# Patient Record
Sex: Female | Born: 2000 | Race: Black or African American | Hispanic: No | Marital: Single | Smoking: Never smoker
Health system: Southern US, Community
[De-identification: ages and names within clinical notes are randomized; demographics above are authoritative.]

---

## 2020-04-13 ENCOUNTER — Encounter (HOSPITAL_COMMUNITY): Payer: Self-pay | Admitting: Emergency Medicine

## 2020-04-13 ENCOUNTER — Ambulatory Visit (HOSPITAL_COMMUNITY)
Admission: EM | Admit: 2020-04-13 | Discharge: 2020-04-13 | Disposition: A | Discharge status: Home / Self Care | Payer: Medicaid Other | Attending: Family Medicine | Admitting: Family Medicine

## 2020-04-13 ENCOUNTER — Other Ambulatory Visit: Payer: Self-pay

## 2020-04-13 DIAGNOSIS — H9191 Unspecified hearing loss, right ear: Secondary | ICD-10-CM | POA: Diagnosis not present

## 2020-04-13 DIAGNOSIS — H6121 Impacted cerumen, right ear: Secondary | ICD-10-CM

## 2020-04-13 NOTE — ED Triage Notes (Signed)
Pt presents with right ear discomfort and hearing loss xs 5 days. States has cleaned with hydrogen peroxide.

## 2020-04-13 NOTE — ED Provider Notes (Signed)
  [facility]   [id number] 04/13/20 Arrival Time: 1217  ASSESSMENT & PLAN:  1. Impacted cerumen of right ear   2. Decreased hearing of right ear     Cerumen removed from R ear canal with curette by me. No complications. Reports normal hearing.  May f/u as needed. Reviewed expectations re: course of current medical issues. Questions answered. Outlined signs and symptoms indicating need for more acute intervention. Patient verbalized understanding. After Visit Summary given.   SUBJECTIVE: History from: patient.  Alisha Clark is a 20 y.o. female who presents with complaint of decreased hearing from R ear; gradual over past week. No pain. Does occas use Q-tips in ears. No injury/bleeding. No recent illnesses.   OBJECTIVE:  Vitals:   04/13/20 1248  BP: 101/69  Pulse: 66  Resp: 16  Temp: 98.2 F (36.8 C)  TempSrc: Oral  SpO2: 98%     General appearance: alert; NAD Ear Canal: cerumen on the right TM: right: normal  Neck: supple without LAD Lungs: unlabored respirations, symmetrical air entry; cough: absent; no respiratory distress Skin: warm and dry Psychological: alert and cooperative; normal mood and affect  No Known Allergies  History reviewed. No pertinent past medical history. History reviewed. No pertinent family history. Social History   Socioeconomic History  . Marital status: Single    Spouse name: Not on file  . Number of children: Not on file  . Years of education: Not on file  . Highest education level: Not on file  Occupational History  . Not on file  Tobacco Use  . Smoking status: Never Smoker  . Smokeless tobacco: Never Used  Substance and Sexual Activity  . Alcohol use: Never  . Drug use: Not on file  . Sexual activity: Not on file  Other Topics Concern  . Not on file  Social History Narrative  . Not on file   Social Determinants of Health   Financial Resource Strain: Not on file  Food Insecurity: Not on file   Transportation Needs: Not on file  Physical Activity: Not on file  Stress: Not on file  Social Connections: Not on file  Intimate Partner Violence: Not on file            Mardella Layman, MD 04/13/20 1427

## 2020-04-20 ENCOUNTER — Ambulatory Visit (HOSPITAL_COMMUNITY)
Admission: EM | Admit: 2020-04-20 | Discharge: 2020-04-20 | Disposition: A | Discharge status: Home / Self Care | Payer: Medicaid Other

## 2020-04-20 ENCOUNTER — Other Ambulatory Visit: Payer: Self-pay

## 2020-04-20 ENCOUNTER — Emergency Department (HOSPITAL_COMMUNITY)
Admission: EM | Admit: 2020-04-20 | Discharge: 2020-04-20 | Disposition: A | Discharge status: Home / Self Care | Payer: Medicaid Other | Attending: Emergency Medicine | Admitting: Emergency Medicine

## 2020-04-20 ENCOUNTER — Encounter (HOSPITAL_COMMUNITY): Payer: Self-pay

## 2020-04-20 DIAGNOSIS — Z5321 Procedure and treatment not carried out due to patient leaving prior to being seen by health care provider: Secondary | ICD-10-CM | POA: Diagnosis not present

## 2020-04-20 DIAGNOSIS — R1110 Vomiting, unspecified: Secondary | ICD-10-CM | POA: Diagnosis not present

## 2020-04-20 DIAGNOSIS — R109 Unspecified abdominal pain: Secondary | ICD-10-CM | POA: Insufficient documentation

## 2020-04-20 DIAGNOSIS — R531 Weakness: Secondary | ICD-10-CM | POA: Diagnosis not present

## 2020-04-20 LAB — CBC
HCT: 40.4 % (ref 36.0–46.0)
Hemoglobin: 13.9 g/dL (ref 12.0–15.0)
MCH: 31.5 pg (ref 26.0–34.0)
MCHC: 34.4 g/dL (ref 30.0–36.0)
MCV: 91.6 fL (ref 80.0–100.0)
Platelets: 202 10*3/uL (ref 150–400)
RBC: 4.41 MIL/uL (ref 3.87–5.11)
RDW: 12.6 % (ref 11.5–15.5)
WBC: 16.6 10*3/uL — ABNORMAL HIGH (ref 4.0–10.5)
nRBC: 0 % (ref 0.0–0.2)

## 2020-04-20 LAB — COMPREHENSIVE METABOLIC PANEL
ALT: 12 U/L (ref 0–44)
AST: 14 U/L — ABNORMAL LOW (ref 15–41)
Albumin: 3.9 g/dL (ref 3.5–5.0)
Alkaline Phosphatase: 42 U/L (ref 38–126)
Anion gap: 10 (ref 5–15)
BUN: 14 mg/dL (ref 6–20)
CO2: 23 mmol/L (ref 22–32)
Calcium: 9.4 mg/dL (ref 8.9–10.3)
Chloride: 105 mmol/L (ref 98–111)
Creatinine, Ser: 1.22 mg/dL — ABNORMAL HIGH (ref 0.44–1.00)
GFR, Estimated: >60 mL/min (ref >60)
Glucose, Bld: 97 mg/dL (ref 70–99)
Potassium: 3.5 mmol/L (ref 3.5–5.1)
Sodium: 138 mmol/L (ref 135–145)
Total Bilirubin: 1.1 mg/dL (ref 0.3–1.2)
Total Protein: 7.1 g/dL (ref 6.5–8.1)

## 2020-04-20 LAB — URINALYSIS, ROUTINE W REFLEX MICROSCOPIC
Bilirubin Urine: NEGATIVE
Glucose, UA: NEGATIVE mg/dL
Ketones, ur: 20 mg/dL — AB
Nitrite: NEGATIVE
Protein, ur: 100 mg/dL — AB
Specific Gravity, Urine: 1.024 (ref 1.005–1.030)
WBC, UA: >50 WBC/hpf — ABNORMAL HIGH (ref 0–5)
pH: 5 (ref 5.0–8.0)

## 2020-04-20 LAB — I-STAT BETA HCG BLOOD, ED (MC, WL, AP ONLY): I-stat hCG, quantitative: <5 m[IU]/mL (ref <5)

## 2020-04-20 LAB — LIPASE, BLOOD: Lipase: 24 U/L (ref 11–51)

## 2020-04-20 NOTE — ED Triage Notes (Signed)
Patient complains of generalized weakness, vomiting, and abdominal pain that started suddenly yesterday. Denies diarrhea. Patient alert, oriented, and in no apparent distress at this time.

## 2020-04-20 NOTE — ED Notes (Signed)
Pt reported to [place], [place].  Pt went to the ED due to abdominal pain.

## 2020-04-20 NOTE — ED Notes (Signed)
Pt up to desk stating that she will be leaving, returned labels to be shredded.

## 2020-04-20 NOTE — ED Triage Notes (Signed)
Pt reports pain around belly button "feels tight" x 1 day. States she feels nauseous in the lobby. Denies fever, diarrhea, vomiting. Ibuprofen gives no relief, last dose 8 am today.   Pt wants to know if the symptoms are due to COVID vaccine side effect, as she had 2 dose 5 days ago.

## 2020-04-29 ENCOUNTER — Encounter (HOSPITAL_COMMUNITY): Payer: Self-pay

## 2020-04-29 ENCOUNTER — Emergency Department (HOSPITAL_COMMUNITY): Payer: Medicaid Other

## 2020-04-29 ENCOUNTER — Emergency Department (HOSPITAL_COMMUNITY)
Admission: EM | Admit: 2020-04-29 | Discharge: 2020-04-29 | Disposition: A | Discharge status: Home / Self Care | Payer: Medicaid Other | Attending: Emergency Medicine | Admitting: Emergency Medicine

## 2020-04-29 ENCOUNTER — Other Ambulatory Visit: Payer: Self-pay

## 2020-04-29 DIAGNOSIS — R1084 Generalized abdominal pain: Secondary | ICD-10-CM | POA: Insufficient documentation

## 2020-04-29 DIAGNOSIS — R109 Unspecified abdominal pain: Secondary | ICD-10-CM

## 2020-04-29 LAB — COMPREHENSIVE METABOLIC PANEL
ALT: 13 U/L (ref 0–44)
AST: 15 U/L (ref 15–41)
Albumin: 3.9 g/dL (ref 3.5–5.0)
Alkaline Phosphatase: 41 U/L (ref 38–126)
Anion gap: 11 (ref 5–15)
BUN: 16 mg/dL (ref 6–20)
CO2: 24 mmol/L (ref 22–32)
Calcium: 8.9 mg/dL (ref 8.9–10.3)
Chloride: 102 mmol/L (ref 98–111)
Creatinine, Ser: 0.94 mg/dL (ref 0.44–1.00)
GFR, Estimated: >60 mL/min (ref >60)
Glucose, Bld: 84 mg/dL (ref 70–99)
Potassium: 3.6 mmol/L (ref 3.5–5.1)
Sodium: 137 mmol/L (ref 135–145)
Total Bilirubin: 0.4 mg/dL (ref 0.3–1.2)
Total Protein: 7.2 g/dL (ref 6.5–8.1)

## 2020-04-29 LAB — URINALYSIS, ROUTINE W REFLEX MICROSCOPIC
Bacteria, UA: NONE SEEN
Bilirubin Urine: NEGATIVE
Glucose, UA: NEGATIVE mg/dL
Hgb urine dipstick: NEGATIVE
Ketones, ur: 5 mg/dL — AB
Nitrite: NEGATIVE
Protein, ur: 30 mg/dL — AB
Specific Gravity, Urine: 1.034 — ABNORMAL HIGH (ref 1.005–1.030)
pH: 5 (ref 5.0–8.0)

## 2020-04-29 LAB — WET PREP, GENITAL
Sperm: NONE SEEN
Trich, Wet Prep: NONE SEEN
Yeast Wet Prep HPF POC: NONE SEEN

## 2020-04-29 LAB — GC/CHLAMYDIA PROBE AMP (~~LOC~~) NOT AT ARMC
Chlamydia: NEGATIVE
Comment: NEGATIVE
Comment: NORMAL
Neisseria Gonorrhea: POSITIVE — AB

## 2020-04-29 LAB — CBC
HCT: 38.3 % (ref 36.0–46.0)
Hemoglobin: 12.5 g/dL (ref 12.0–15.0)
MCH: 30.7 pg (ref 26.0–34.0)
MCHC: 32.6 g/dL (ref 30.0–36.0)
MCV: 94.1 fL (ref 80.0–100.0)
Platelets: 264 10*3/uL (ref 150–400)
RBC: 4.07 MIL/uL (ref 3.87–5.11)
RDW: 12.4 % (ref 11.5–15.5)
WBC: 9.3 10*3/uL (ref 4.0–10.5)
nRBC: 0 % (ref 0.0–0.2)

## 2020-04-29 LAB — LIPASE, BLOOD: Lipase: 30 U/L (ref 11–51)

## 2020-04-29 LAB — I-STAT BETA HCG BLOOD, ED (MC, WL, AP ONLY): I-stat hCG, quantitative: <5 m[IU]/mL (ref <5)

## 2020-04-29 MED ORDER — PANTOPRAZOLE SODIUM 20 MG PO TBEC: 20.0000 mg | DELAYED_RELEASE_TABLET | Freq: Every day | ORAL | 0 refills | Status: DC

## 2020-04-29 MED ORDER — IOHEXOL 300 MG/ML  SOLN
100.0000 mL | Freq: Once | INTRAMUSCULAR | Status: AC | PRN
  Administered 2020-04-29: 100 mL via INTRAVENOUS

## 2020-04-29 MED ORDER — SODIUM CHLORIDE 0.9 % IV BOLUS
1000.0000 mL | Freq: Once | INTRAVENOUS | Status: AC
  Administered 2020-04-29: 1000 mL via INTRAVENOUS

## 2020-04-29 MED ORDER — SUCRALFATE 1 GM/10ML PO SUSP: 1.0000 g | Freq: Three times a day (TID) | ORAL | 0 refills | Status: DC

## 2020-04-29 MED ORDER — FENTANYL CITRATE (PF) 100 MCG/2ML IJ SOLN
50.0000 ug | Freq: Once | INTRAMUSCULAR | Status: AC
  Administered 2020-04-29: 50 ug via INTRAVENOUS
  Filled 2020-04-29: qty 2

## 2020-04-29 MED ORDER — ONDANSETRON 4 MG PO TBDP: 4.0000 mg | ORAL_TABLET | Freq: Three times a day (TID) | ORAL | 0 refills | Status: AC | PRN

## 2020-04-29 MED ORDER — KETOROLAC TROMETHAMINE 30 MG/ML IJ SOLN
30.0000 mg | Freq: Once | INTRAMUSCULAR | Status: AC
  Administered 2020-04-29: 30 mg via INTRAVENOUS
  Filled 2020-04-29: qty 1

## 2020-04-29 NOTE — Discharge Instructions (Addendum)
You are seen in the emergency department today for abdominal pain.  Your blood work was overall reassuring.   Your pelvic ultrasound showed a bleeding cyst on your left ovary. This can be painful, but is not concerning for your overall health. Please follow up with your OBGYN. Take ibuprofen and /or tylenol for your symptoms.   We are sending you home with the following medications to help with your symptoms:   - Zofran- please take every 8 hours as needed for nausea/vomiting.   You make take tylenol as well as pepto bismol with these medications as well per over the counter dosing.  We have prescribed you new medication(s) today. Discuss the medications prescribed today with your pharmacist as they can have adverse effects and interactions with your other medicines including over the counter and prescribed medications. Seek medical evaluation if you start to experience new or abnormal symptoms after taking one of these medicines, seek care immediately if you start to experience difficulty breathing, feeling of your throat closing, facial swelling, or rash as these could be indications of a more serious allergic reaction  When you are have vaginal bleeding/menstruating please try taking ibuprofen per over the counter dosing.  Please follow attached diet guidelines.   Follow up with your primary care provider within 3 days for re-evaluation. Call the gastroenterologist above for follow up appointment.  Return to the ER for new or worsening symptoms including but not limited to worsened pain, new pain, inability to keep fluids down, blood in vomit/stool, passing out, or any other concerns.

## 2020-04-29 NOTE — ED Provider Notes (Signed)
  Physical Exam  BP 117/77   Pulse 70   Temp 98.6 F (37 C) (Oral)   Resp 17   Ht 5\' 5"  (1.651 m)   Wt 59 kg   SpO2 100%   BMI 21.63 kg/m   Physical Exam  ED Course/Procedures     Procedures  MDM   Care of this patient assumed from preceding ED provider, , PA-C at shift change.  Please see her note for further details of patient's ED course.  In brief 20 year old female with 2 weeks of vague nonlocalized abdominal pain that will have episodes of exacerbation in more local area as though the location of worst pain change changes.  Today's pain is worsening right upper quadrant.  Denies nausea and vomiting, denies fevers, chills, diarrhea, melena, hematochezia, constipation, dysuria, vaginal bleeding or discharge.  CBC unremarkable, CMP unremarkable, UA with ketonuria and proteinuria, wet prep with clue cells and white cells, but patient without new vaginal discharge.  CT scan of abdomen pelvis concerning for questionable tubular versus multiseptated cystic lesion within the left adnexa with associated free fluid, radiologist recommending pelvic ultrasound.  Ultrasound pending at this time.  Pelvic ultrasound with small hemorrhagic corpus luteum in the left ovary 2.8 cm without additional mass.  Normal Doppler to both ovaries.  No further imaging required.  Suspect patient's abdominal pain is secondary in part due to hemorrhagic ovarian cyst.  Patient is well-appearing at time of my exam, she is not in acute distress, denies any abdominal pain at this time.  Recommend she follow-up closely with her OB/GYN. Will offer GI follow up she needs it in the future.    Kenmiya voiced understanding of her medical evaluation and treatment plan.  Each of her questions was answered to her expressed satisfaction.  Return precautions given.  Patient is well-appearing, stable, appropriate for discharge.  This chart was dictated using voice recognition software, Dragon. Despite the  best efforts of this provider to proofread and correct errors, errors may still occur which can change documentation meaning.        36, PA-C 04/29/20 0930    05/01/20, MD 04/30/20 [phone]

## 2020-04-29 NOTE — ED Provider Notes (Addendum)
[facility]-EMERGENCY DEPT Provider Note   CSN: [id number] Arrival date & time: 04/29/20  0028     History Chief Complaint  Patient presents with  . Abdominal Pain    Alisha Clark is a 20 y.o. female without significant past medical hx who presents to the ED with complaints of abdominal pain x 2 weeks. Patient states that she has been having daily abdominal pain, wakes up with aching generalized discomfort and throughout the day it localizes to a certain area and becomes more intense. Often lower abdomen, tonight to RUQ. Pain is very uncomfortable at this time, no current N/V, did have N/V 2 weeks ago with initial onset. Denies fever, chills, current emesis, diarrhea, melena, constipation, dysuria, vaginal bleeding ,or vaginal discharge. Period finished yesterday states she did have a little bit of irregular bleeding over the past 2 weeks. She is sexually active in a monogamous relationship.   HPI     History reviewed. No pertinent past medical history.  There are no problems to display for this patient.   History reviewed. No pertinent surgical history.   OB History   No obstetric history on file.     No family history on file.  Social History   Tobacco Use  . Smoking status: Never Smoker  . Smokeless tobacco: Never Used  Substance Use Topics  . Alcohol use: Never    Home Medications Prior to Admission medications   Medication Sig Start Date End Date Taking? Authorizing Provider  ibuprofen (ADVIL) 200 MG tablet Take 600-800 mg by mouth every 6 (six) hours as needed for moderate pain.   Yes [provider]    Allergies    Patient has no known allergies.  Review of Systems   Review of Systems  Constitutional: Negative for chills and fever.  Respiratory: Negative for cough and shortness of breath.   Cardiovascular: Negative for chest pain.  Gastrointestinal: Positive for abdominal pain, nausea (none tonight) and vomiting (none  tonight). Negative for anal bleeding, blood in stool and diarrhea.  Genitourinary: Negative for dysuria, vaginal bleeding and vaginal discharge.  Neurological: Negative for syncope.  All other systems reviewed and are negative.   Physical Exam Updated Vital Signs BP 98/83   Pulse 92   Temp 98.6 F (37 C) (Oral)   Resp 16   Ht 5\' 5"  (1.651 m)   Wt 59 kg   SpO2 98%   BMI 21.63 kg/m   Physical Exam Vitals and nursing note reviewed.  Constitutional:      General: She is not in acute distress.    Appearance: She is well-developed. She is not toxic-appearing.  HENT:     Head: Normocephalic and atraumatic.  Eyes:     General:        Right eye: No discharge.        Left eye: No discharge.     Conjunctiva/sclera: Conjunctivae normal.  Cardiovascular:     Rate and Rhythm: Normal rate and regular rhythm.  Pulmonary:     Effort: Pulmonary effort is normal. No respiratory distress.     Breath sounds: Normal breath sounds. No wheezing, rhonchi or rales.  Abdominal:     General: There is no distension.     Palpations: Abdomen is soft.     Tenderness: There is generalized abdominal tenderness (wrose on the right side of the abdomen). There is guarding (voluntary on initial evaluation).  Genitourinary:    Comments: Yellow discharge present.  No bleeding.  No cervical motion  tenderness or adnexal tenderness.  Chaperone present throughout exam.  Musculoskeletal:     Cervical back: Neck supple.  Skin:    General: Skin is warm and dry.     Findings: No rash.  Neurological:     Mental Status: She is alert.     Comments: Clear speech.   Psychiatric:        Behavior: Behavior normal.     ED Results / Procedures / Treatments   Labs (all labs ordered are listed, but only abnormal results are displayed) Labs Reviewed  WET PREP, GENITAL - Abnormal; Notable for the following components:      Result Value   Clue Cells Wet Prep HPF POC PRESENT (*)    WBC, Wet Prep HPF POC PRESENT  (*)    All other components within normal limits  URINALYSIS, ROUTINE W REFLEX MICROSCOPIC - Abnormal; Notable for the following components:   Specific Gravity, Urine 1.034 (*)    Ketones, ur 5 (*)    Protein, ur 30 (*)    Leukocytes,Ua TRACE (*)    All other components within normal limits  LIPASE, BLOOD  COMPREHENSIVE METABOLIC PANEL  CBC  I-STAT BETA HCG BLOOD, ED ([facility], [facility], [facility] ONLY)  GC/CHLAMYDIA PROBE AMP ([place]) NOT AT [facility]    EKG None  Radiology CT Abdomen Pelvis W Contrast  Result Date: 04/29/2020 CLINICAL DATA:  Nonlocalized acute abdominal pain. General abdominal pain for 2 weeks, seen last week for same, but left, wbc's 9.3. EXAM: CT ABDOMEN AND PELVIS WITH CONTRAST TECHNIQUE: Multidetector CT imaging of the abdomen and pelvis was performed using the standard protocol following bolus administration of intravenous contrast. CONTRAST:  OMNIPAQUE IOHEXOL 300 MG/ML  SOLN COMPARISON:  None. FINDINGS: Lower chest: Bilateral lower lobe subsegmental atelectasis. Hepatobiliary: Nonspecific mild periportal edema. Subcentimeter hypodensity within the right hepatic lobe too small to characterize (2:14) fluid. No gallstones, gallbladder wall thickening, or pericholecystic fluid. No biliary dilatation. Pancreas: No focal lesion. Normal pancreatic contour. No surrounding inflammatory changes. No main pancreatic ductal dilatation. Spleen: Normal in size without focal abnormality. Adrenals/Urinary Tract: No adrenal nodule bilaterally. Bilateral kidneys enhance symmetrically. No hydronephrosis. No hydroureter. The urinary bladder is unremarkable. Stomach/Bowel: Stomach is within normal limits. No evidence of bowel wall thickening or dilatation. Likely peristalsis of the ascending colon (4:55, 2:66). Suggestion of a gas filled coiled appendix within the right pelvis (2:60). The appendix is not demonstrate an enlarged caliber or inflammatory changes. Vascular/Lymphatic: No significant vascular  findings are present. No enlarged abdominal or pelvic lymph nodes. Reproductive: Question tubular versus multi septated cystic lesion within the left adnexal region (4:77). Otherwise the uterus and right adnexal regions are unremarkable. Other: Trace simple free fluid within the pelvis. No intraperitoneal free gas. No organized fluid collection. Musculoskeletal: No acute or significant osseous findings. IMPRESSION: 1. Question tubular versus multi septated cystic lesion within the left adnexal region. Associated trace pelvic free fluid. Findings could represent normal small bowel versus ovarian/adnexal pathology versus is dilated fallopian tube. Consider pelvic ultrasound for further evaluation if clincally indicated. 2. Normal appendix. Electronically Signed   By: Tish Frederickson M.D.   On: 04/29/2020 06:54    Procedures Procedures   Medications Ordered in ED Medications  sodium chloride 0.9 % bolus 1,000 mL (1,000 mLs Intravenous New Bag/Given 04/29/20 0347)  fentaNYL (SUBLIMAZE) injection 50 mcg (50 mcg Intravenous Given 04/29/20 0348)    ED Course  I have reviewed the triage vital signs and the nursing notes.  Pertinent labs &  imaging results that were available during my care of the patient were reviewed by me and considered in my medical decision making (see chart for details).    MDM Rules/Calculators/A&P                          Patient presents to the ED with complaints of abdominal pain. Patient nontoxic appearing, in no apparent distress, vitals WNL. Patient w/ generalized tenderness worse on the right side, voluntary guarding limiting exam some, became tearful. Pelvic exam without CMT or adnexal tenderness to specifically raise concern for PID at this time. Fentanyl for pain, zofran for nausea.   Additional history obtained:  Additional history obtained from chart review & nursing note review.   Lab Tests:  I Ordered, reviewed, and interpreted labs, which included:   CBC/CMP/lipase: Unremarkable  UA: trace leukocytes, nitrite negative and no bacteria, no urinary sxs, not consistent w/ acute UTI.  Preg test: Negative Wet prep: WBCs, clue cells- no abnormal discharge per patient therefore no BV tx.   Imaging Studies ordered:  I ordered imaging studies which included CT A/P, I independently reviewed, formal radiology impression shows:  1. Question tubular versus multi septated cystic lesion within the left adnexal region. Associated trace pelvic free fluid. Findings could represent normal small bowel versus ovarian/adnexal pathology versus is dilated fallopian tube. Consider pelvic ultrasound for further evaluation if clincally indicated. 2. Normal appendix.  06:30: Patient care signed out to PA Sponsellar @ change of shift pending [place] & disposition. If no significant acute process or change in clinical status anticipate discharge home.   Portions of this note were generated with Scientist, clinical (histocompatibility and immunogenetics). Dictation errors may occur despite best attempts at proofreading.  Final Clinical Impression(s) / ED Diagnoses Final diagnoses:  Abdominal pain, unspecified abdominal location    Rx / DC Orders ED Discharge Orders    None       Cherly Anderson, PA-C 04/29/20 0659    Cherly Anderson, PA-C 04/29/20 0702    Paula Libra, MD 04/29/20 2307

## 2020-04-29 NOTE — ED Notes (Signed)
Pt given 2 warm blankets

## 2020-04-29 NOTE — ED Notes (Signed)
Pelvic exam performed with this RN and Lelon Mast, [place]

## 2020-04-29 NOTE — ED Triage Notes (Signed)
Pt reports generalized abdominal pain for 2 weeks. Denies n/v/d. Reports being seen for similar last week but left prior to room placement.

## 2020-05-02 ENCOUNTER — Other Ambulatory Visit: Payer: Self-pay | Admitting: Obstetrics

## 2020-05-02 DIAGNOSIS — R102 Pelvic and perineal pain: Secondary | ICD-10-CM

## 2020-05-11 ENCOUNTER — Inpatient Hospital Stay: Admission: RE | Admit: 2020-05-11 | Payer: Medicaid Other | Source: Ambulatory Visit

## 2020-05-12 ENCOUNTER — Other Ambulatory Visit: Payer: Medicaid Other

## 2021-01-08 ENCOUNTER — Ambulatory Visit (HOSPITAL_COMMUNITY)
Admission: EM | Admit: 2021-01-08 | Discharge: 2021-01-08 | Disposition: A | Discharge status: Home / Self Care | Payer: Medicaid Other | Attending: Urgent Care | Admitting: Urgent Care

## 2021-01-08 ENCOUNTER — Encounter (HOSPITAL_COMMUNITY): Payer: Self-pay

## 2021-01-08 ENCOUNTER — Other Ambulatory Visit: Payer: Self-pay

## 2021-01-08 DIAGNOSIS — R0981 Nasal congestion: Secondary | ICD-10-CM | POA: Diagnosis not present

## 2021-01-08 DIAGNOSIS — J029 Acute pharyngitis, unspecified: Secondary | ICD-10-CM | POA: Diagnosis not present

## 2021-01-08 LAB — POCT RAPID STREP A, ED / UC: Streptococcus, Group A Screen (Direct): NEGATIVE

## 2021-01-08 MED ORDER — PSEUDOEPHEDRINE HCL 30 MG PO TABS: 30.0000 mg | ORAL_TABLET | Freq: Three times a day (TID) | ORAL | 0 refills | Status: AC | PRN

## 2021-01-08 MED ORDER — PENICILLIN V POTASSIUM 250 MG/5ML PO SOLR: 500.0000 mg | Freq: Three times a day (TID) | ORAL | 0 refills | Status: AC

## 2021-01-08 MED ORDER — CETIRIZINE HCL 10 MG PO TABS: 10.0000 mg | ORAL_TABLET | Freq: Every day | ORAL | 0 refills | Status: AC

## 2021-01-08 NOTE — ED Triage Notes (Signed)
Pt co of sore throat for 2 days, pt has been taking ibuprofen and tylenol for the pain. Pt is concerned for strep throat. Does not report fever.

## 2021-01-08 NOTE — ED Provider Notes (Signed)
[facility]   MRN: [id number] DOB: May 06, 2000  Subjective:   Alisha Clark is a 20 y.o. female presenting for 2-day history of acute onset fever, throat pain, runny and stuffy nose.  Has noticed swelling of her tonsils and white spots.  Has also had very painful swallowing.  No cough, chest pain, shortness of breath or wheezing.  No current facility-administered medications for this encounter.  Current Outpatient Medications:    ibuprofen (ADVIL) 200 MG tablet, Take 600-800 mg by mouth every 6 (six) hours as needed for moderate pain., Disp: , Rfl:    ondansetron (ZOFRAN ODT) 4 MG disintegrating tablet, Take 1 tablet (4 mg total) by mouth every 8 (eight) hours as needed for nausea or vomiting., Disp: 5 tablet, Rfl: 0   No Known Allergies  History reviewed. No pertinent past medical history.   History reviewed. No pertinent surgical history.  History reviewed. No pertinent family history.  Social History   Tobacco Use   Smoking status: Never   Smokeless tobacco: Never  Substance Use Topics   Alcohol use: Never    Comment: occ    ROS   Objective:   Vitals: Ht 5\' 7"  (1.702 m)   Wt 122 lb (55.3 kg)   LMP 12/04/2020 (Approximate)   BMI 19.11 kg/m   Physical Exam Constitutional:      General: She is not in acute distress.    Appearance: Normal appearance. She is well-developed. She is not ill-appearing, toxic-appearing or diaphoretic.  HENT:     Head: Normocephalic and atraumatic.     Right Ear: External ear normal.     Left Ear: External ear normal.     Nose: Nose normal.     Mouth/Throat:     Mouth: Mucous membranes are moist.     Pharynx: Pharyngeal swelling, oropharyngeal exudate and posterior oropharyngeal erythema present. No uvula swelling.     Tonsils: Tonsillar exudate present. No tonsillar abscesses. 2+ on the right. 2+ on the left.  Eyes:     General: No scleral icterus.       Right eye: No discharge.        Left eye: No  discharge.     Extraocular Movements: Extraocular movements intact.     Pupils: Pupils are equal, round, and reactive to light.  Cardiovascular:     Rate and Rhythm: Normal rate.  Pulmonary:     Effort: Pulmonary effort is normal.  Skin:    General: Skin is warm and dry.  Neurological:     General: No focal deficit present.     Mental Status: She is alert and oriented to person, place, and time.  Psychiatric:        Mood and Affect: Mood normal.        Behavior: Behavior normal.   Results for orders placed or performed during the hospital encounter of 01/08/21 (from the past 24 hour(s))  POCT Rapid Strep A     Status: None   Collection Time: 01/08/21 10:44 AM  Result Value Ref Range   Streptococcus, Group A Screen (Direct) NEGATIVE NEGATIVE    Assessment and Plan :   PDMP not reviewed this encounter.  1. Pharyngitis, unspecified etiology   2. Sore throat   3. Nasal congestion    Will treat empirically for pharyngitis given physical exam findings.  Patient is to start penicillin, use supportive care otherwise. Counseled patient on potential for adverse effects with medications prescribed/recommended today, ER and return-to-clinic precautions discussed, patient  verbalized understanding.    Wallis Bamberg, PA-C 01/08/21 1046

## 2021-01-10 LAB — CULTURE, GROUP A STREP (THRC)

## 2022-01-17 ENCOUNTER — Other Ambulatory Visit: Payer: Self-pay | Admitting: Nurse Practitioner

## 2022-01-17 DIAGNOSIS — N6311 Unspecified lump in the right breast, upper outer quadrant: Secondary | ICD-10-CM

## 2023-03-17 IMAGING — US US PELVIS COMPLETE
1 series · 13 of 25 positions shown · non-contrast
Comparison: CT abdomen and pelvis 04/29/2020

CLINICAL DATA: Abdominal pain, LEFT lower quadrant pain, pelvic
pain, LEFT adnexal cyst on CT

EXAM:
TRANSABDOMINAL AND TRANSVAGINAL ULTRASOUND OF PELVIS
DOPPLER ULTRASOUND OF OVARIES
TECHNIQUE: Both transabdominal and transvaginal ultrasound examinations of the
pelvis were performed. Transabdominal technique was performed for
global imaging of the pelvis including uterus, ovaries, adnexal
regions, and pelvic cul-de-sac.
It was necessary to proceed with endovaginal exam following the
transabdominal exam to visualize the uterus, endometrium, and
ovaries. Color and duplex Doppler ultrasound was utilized to
evaluate blood flow to the ovaries.

[Series 1: us pelvis complete · 13 of 91 slices shown]
[im 1/91]
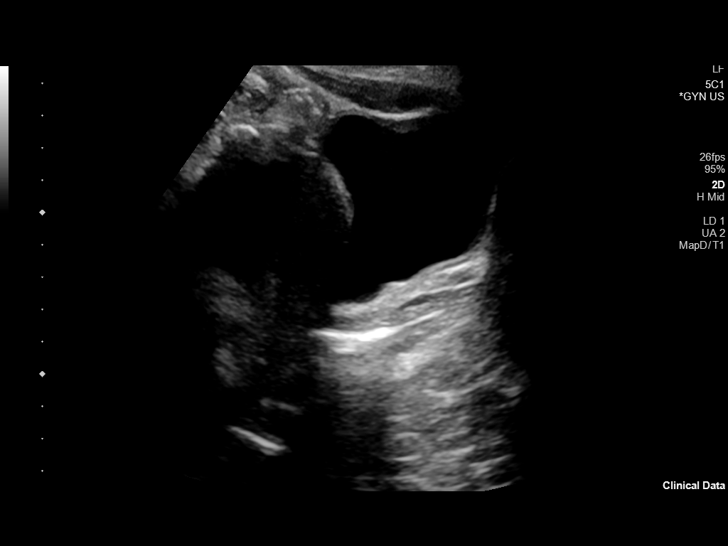
[im 8/91]
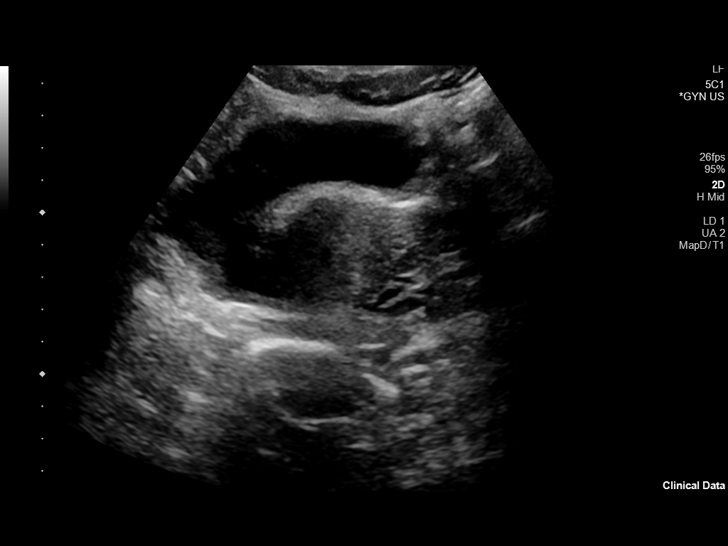
[im 16/91]
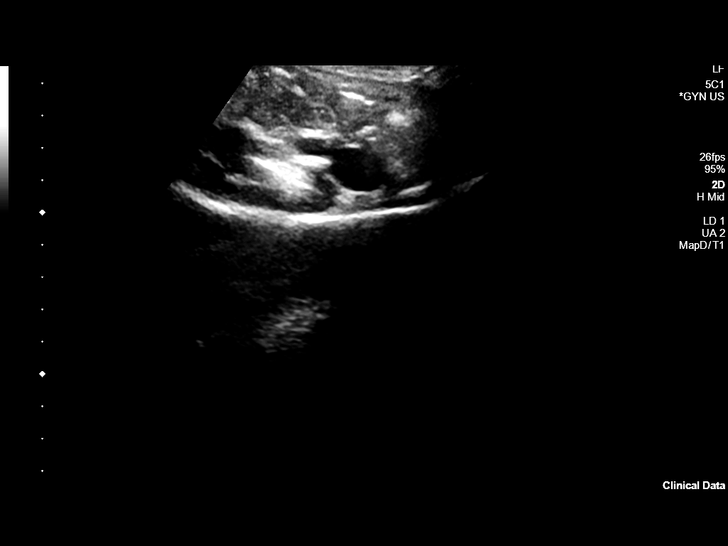
[im 23/91]
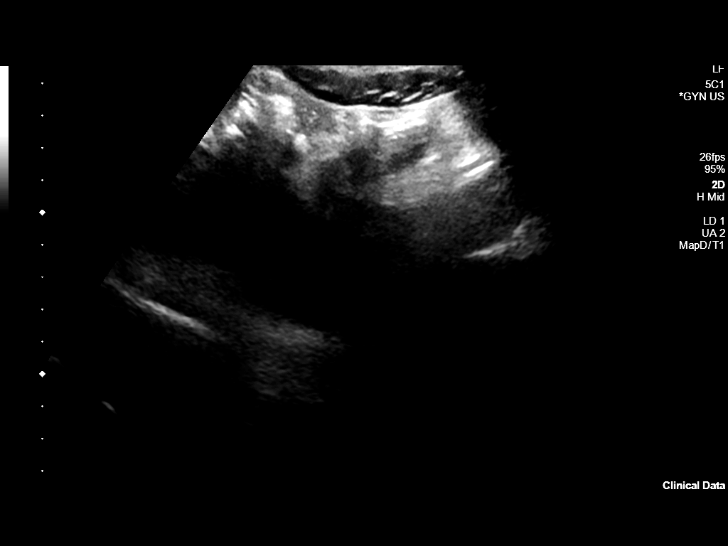
[im 31/91]
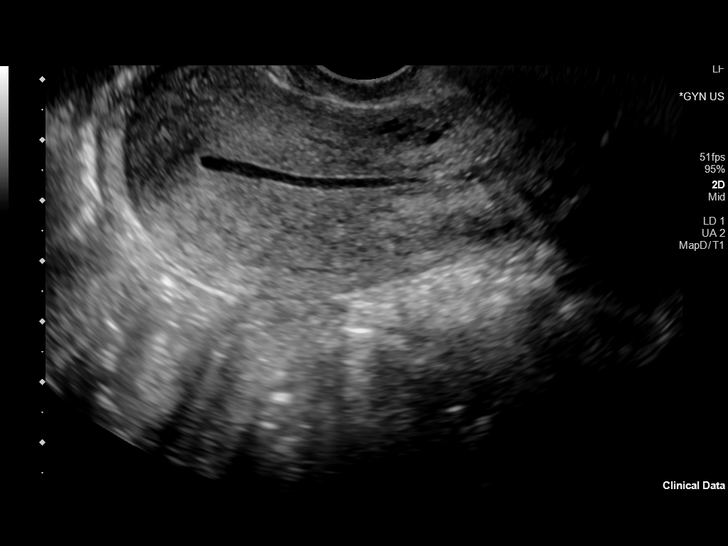
[im 38/91]
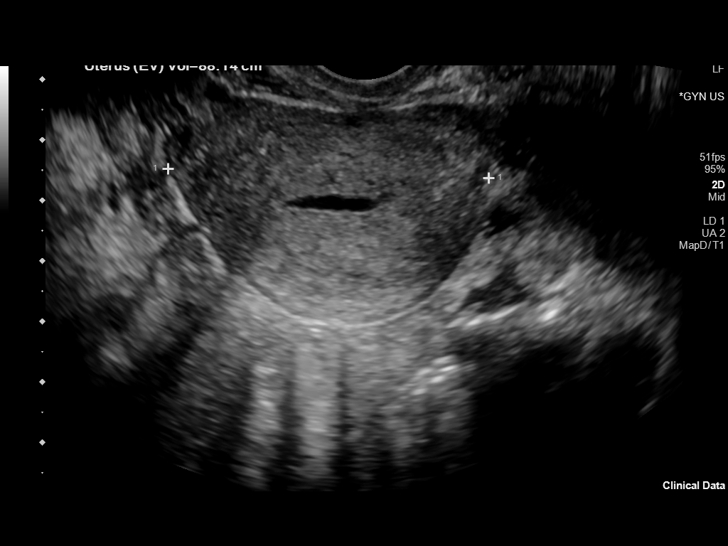
[im 46/91]
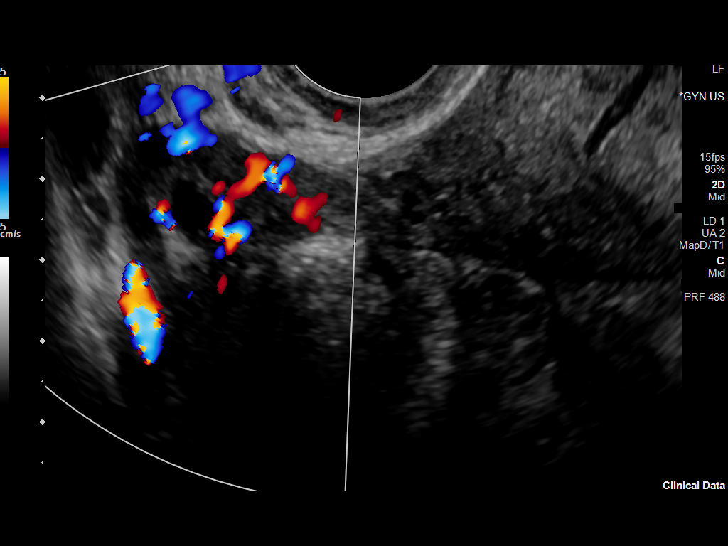
[im 53/91]
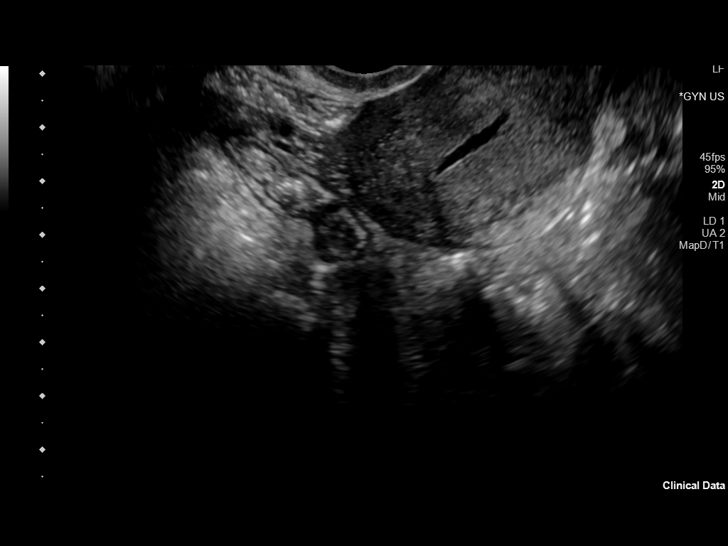
[im 61/91]
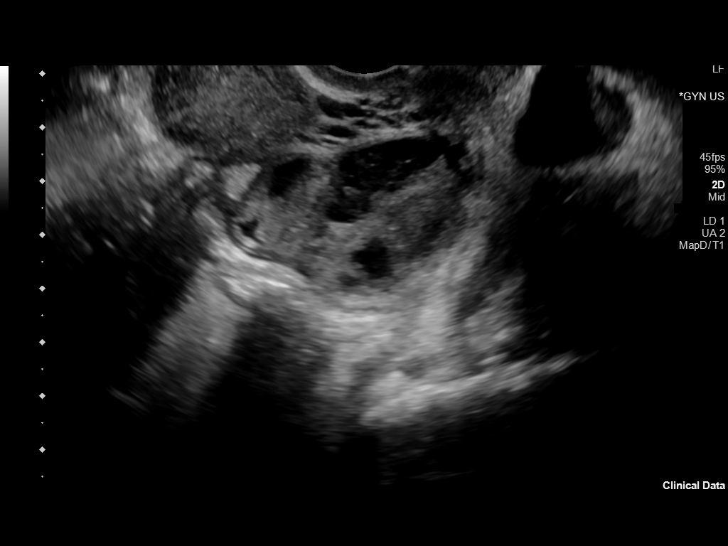
[im 68/91]
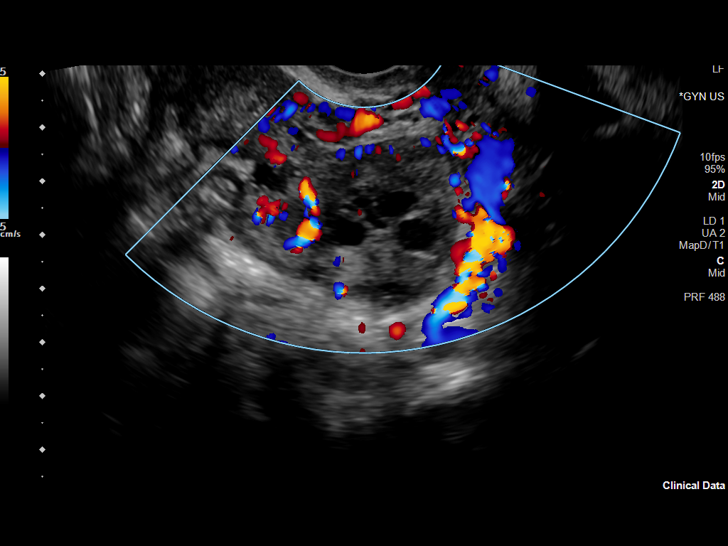
[im 76/91]
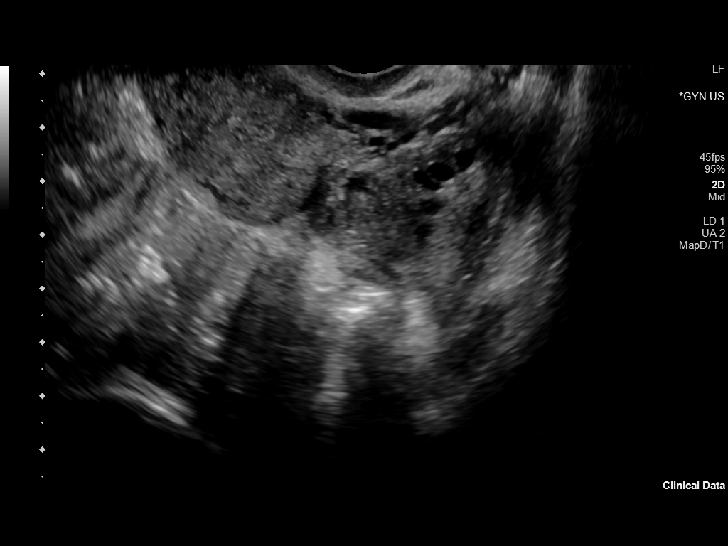
[im 83/91]
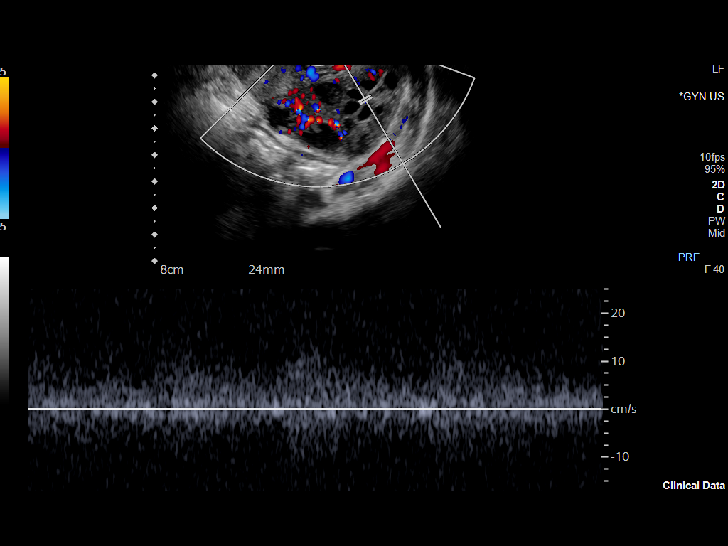
[im 91/91]
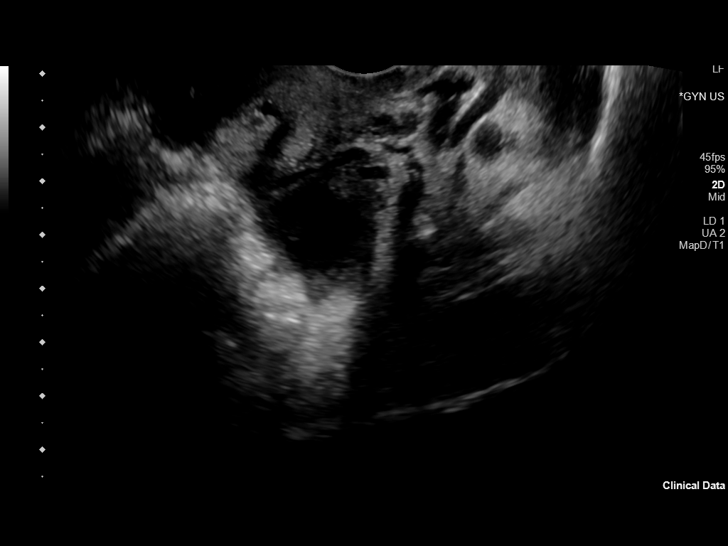

[13 of 25 positions shown; findings below may reference images not displayed]

FINDINGS: Uterus

Measurements: 8.4 x 3.8 x 5.3 cm = volume: 88 mL. Anteverted. Normal
morphology without mass

Endometrium

Thickness: 2 mm. Small amount of nonspecific endometrial fluid. No
focal mass.

Right ovary

Measurements: 4.1 x 2.6 x 2.7 cm = volume: 15.6 mL. Normal
morphology without mass

Left ovary

Measurements: 4.9 x 3.5 x 5.4 cm = volume: 47.6 mL. Small
hemorrhagic corpus luteum 2.8 cm diameter; no follow-up imaging
recommended. Scattered follicles without additional mass. Internal
blood flow present on color Doppler imaging.

Pulsed Doppler evaluation of both ovaries demonstrates normal
low-resistance arterial and venous waveforms.

Other findings

Small amount of nonspecific free pelvic fluid. No adnexal masses or
fallopian tube dilatation.
IMPRESSION: Small amount of nonspecific free pelvic fluid.

Otherwise negative exam.

No evidence of ovarian mass or torsion.

## 2023-03-17 IMAGING — US US ART/VEN ABD/PELV/SCROTUM DOPPLER LTD
1 series · 13 of 25 positions shown · non-contrast
Comparison: CT abdomen and pelvis 04/29/2020

CLINICAL DATA: Abdominal pain, LEFT lower quadrant pain, pelvic
pain, LEFT adnexal cyst on CT

EXAM:
TRANSABDOMINAL AND TRANSVAGINAL ULTRASOUND OF PELVIS
DOPPLER ULTRASOUND OF OVARIES
TECHNIQUE: Both transabdominal and transvaginal ultrasound examinations of the
pelvis were performed. Transabdominal technique was performed for
global imaging of the pelvis including uterus, ovaries, adnexal
regions, and pelvic cul-de-sac.
It was necessary to proceed with endovaginal exam following the
transabdominal exam to visualize the uterus, endometrium, and
ovaries. Color and duplex Doppler ultrasound was utilized to
evaluate blood flow to the ovaries.

[Series 1: us art/ven abd/pelv/scrotum doppler ltd · 13 of 91 slices shown]
[im 1/91]
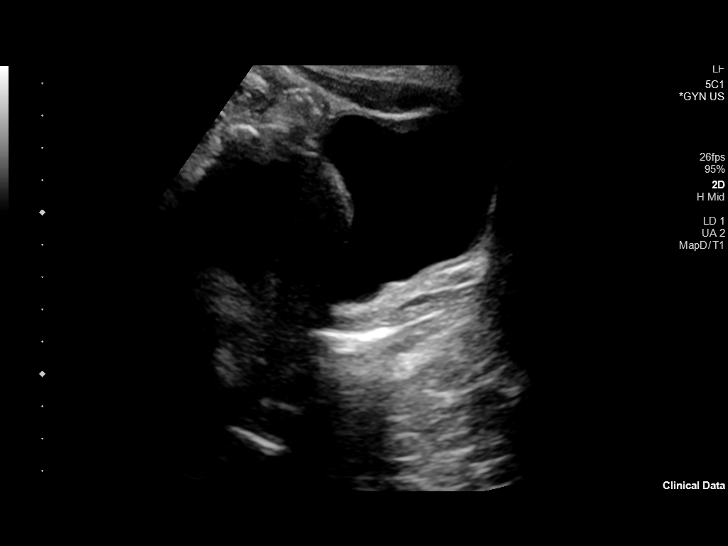
[im 8/91]
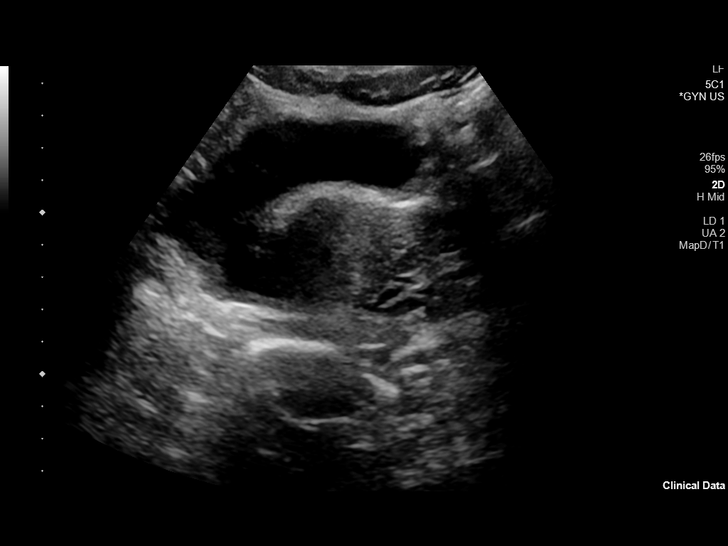
[im 16/91]
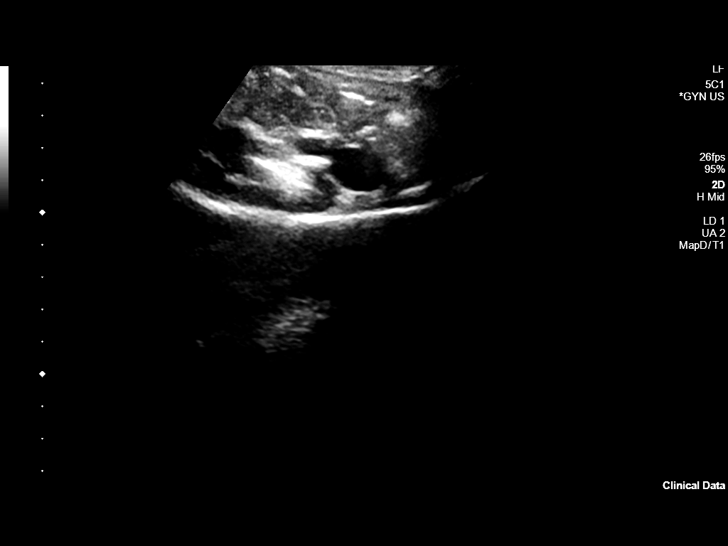
[im 23/91]
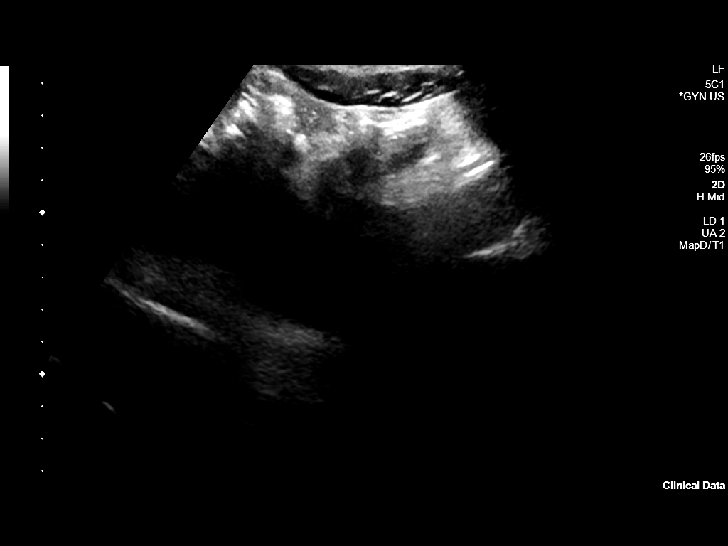
[im 31/91]
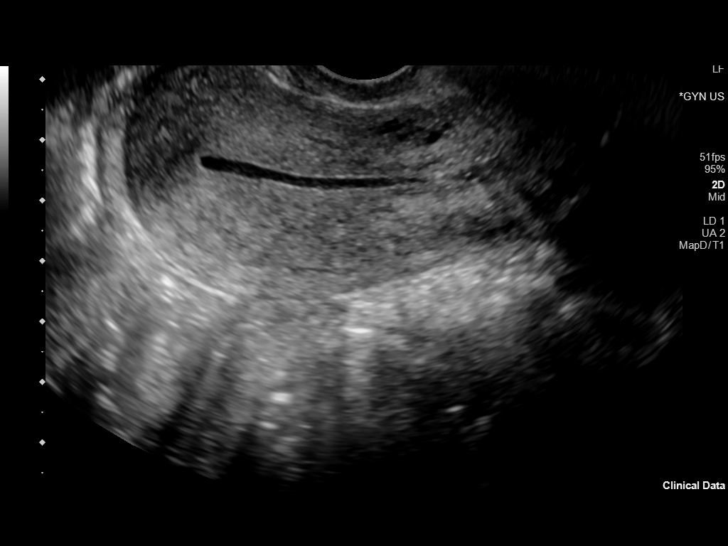
[im 38/91]
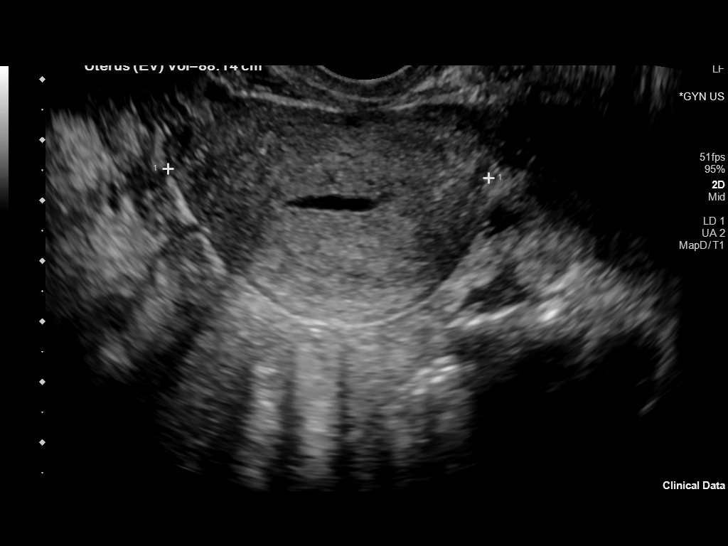
[im 46/91]
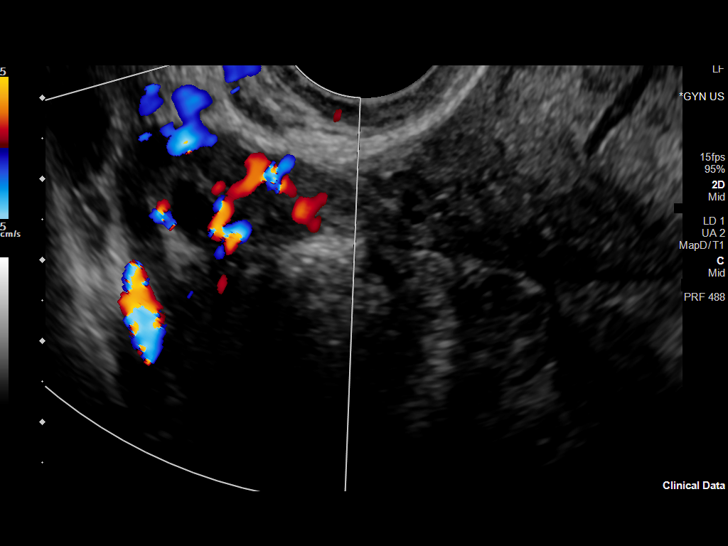
[im 53/91]
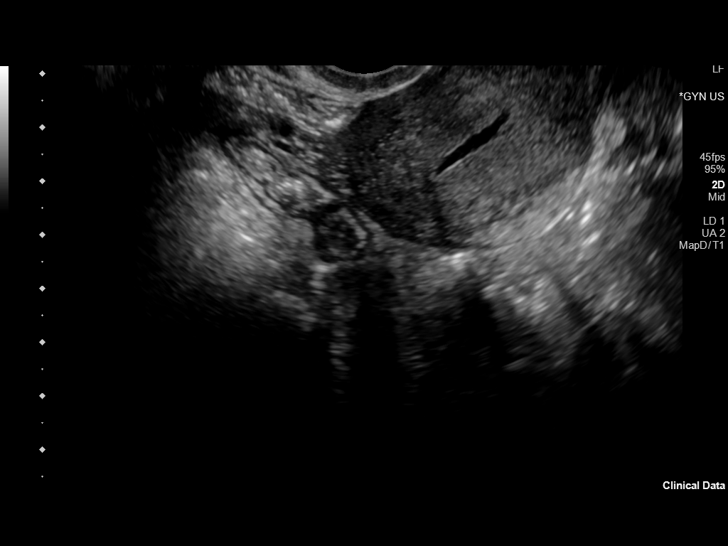
[im 61/91]
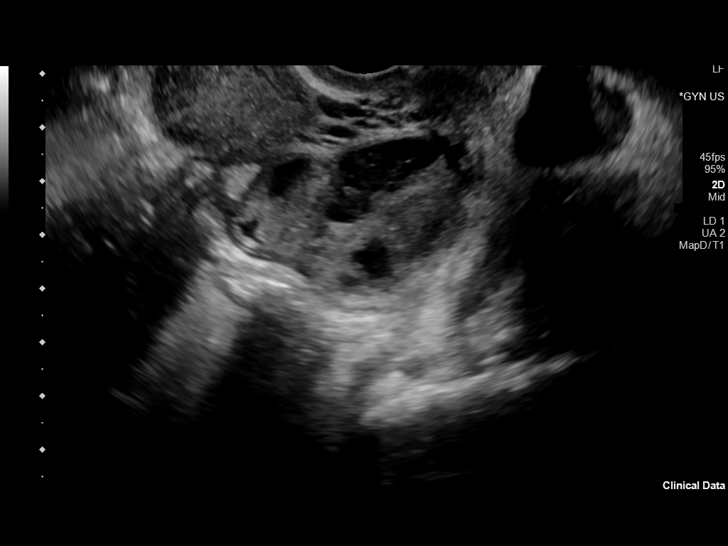
[im 68/91]
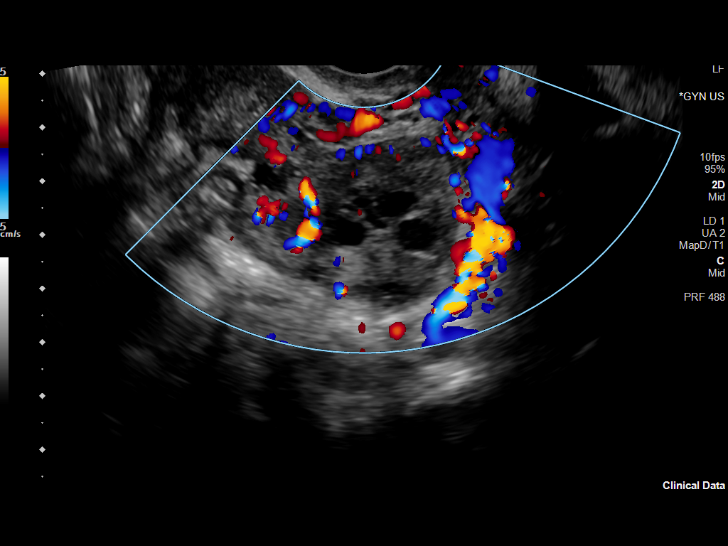
[im 76/91]
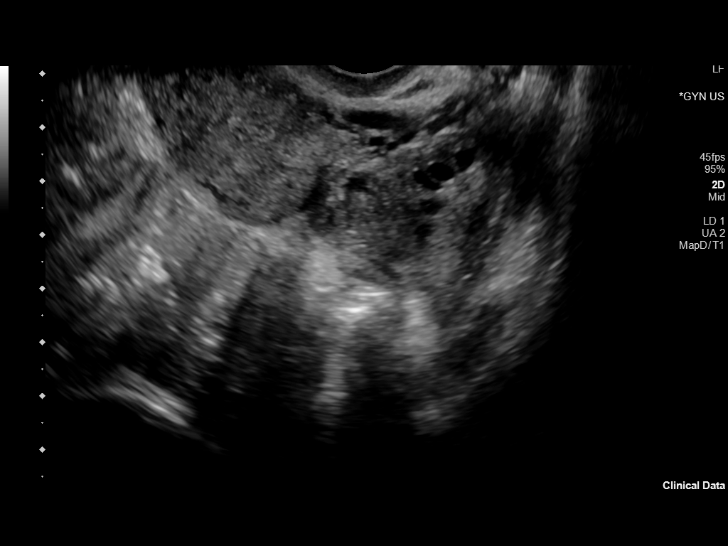
[im 83/91]
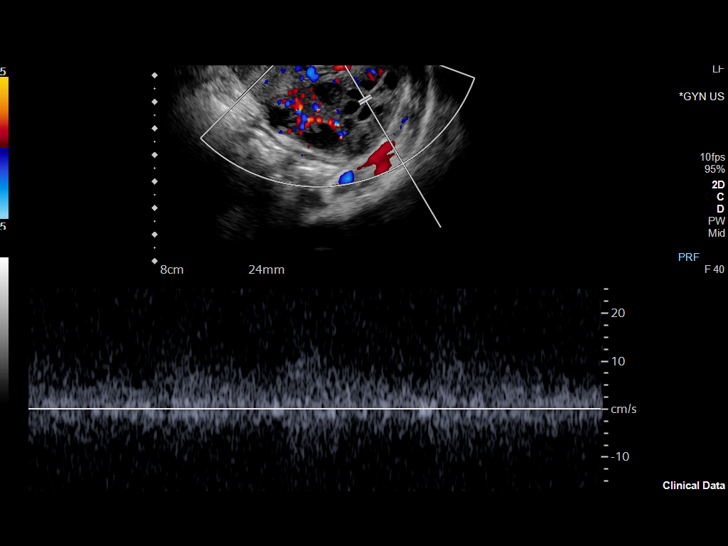
[im 91/91]
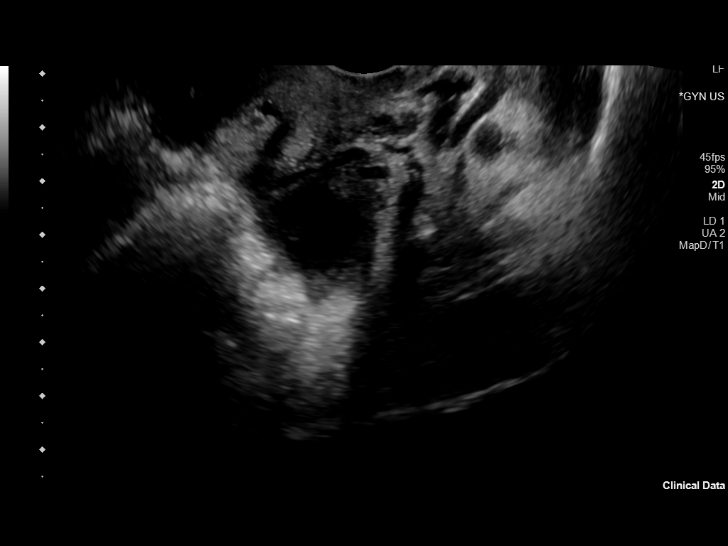

[13 of 25 positions shown; findings below may reference images not displayed]

FINDINGS: Uterus

Measurements: 8.4 x 3.8 x 5.3 cm = volume: 88 mL. Anteverted. Normal
morphology without mass

Endometrium

Thickness: 2 mm. Small amount of nonspecific endometrial fluid. No
focal mass.

Right ovary

Measurements: 4.1 x 2.6 x 2.7 cm = volume: 15.6 mL. Normal
morphology without mass

Left ovary

Measurements: 4.9 x 3.5 x 5.4 cm = volume: 47.6 mL. Small
hemorrhagic corpus luteum 2.8 cm diameter; no follow-up imaging
recommended. Scattered follicles without additional mass. Internal
blood flow present on color Doppler imaging.

Pulsed Doppler evaluation of both ovaries demonstrates normal
low-resistance arterial and venous waveforms.

Other findings

Small amount of nonspecific free pelvic fluid. No adnexal masses or
fallopian tube dilatation.
IMPRESSION: Small amount of nonspecific free pelvic fluid.

Otherwise negative exam.

No evidence of ovarian mass or torsion.

## 2023-03-17 IMAGING — US US TRANSVAGINAL NON-OB
1 series · 13 of 25 positions shown · non-contrast
Comparison: CT abdomen and pelvis 04/29/2020

CLINICAL DATA: Abdominal pain, LEFT lower quadrant pain, pelvic
pain, LEFT adnexal cyst on CT

EXAM:
TRANSABDOMINAL AND TRANSVAGINAL ULTRASOUND OF PELVIS
DOPPLER ULTRASOUND OF OVARIES
TECHNIQUE: Both transabdominal and transvaginal ultrasound examinations of the
pelvis were performed. Transabdominal technique was performed for
global imaging of the pelvis including uterus, ovaries, adnexal
regions, and pelvic cul-de-sac.
It was necessary to proceed with endovaginal exam following the
transabdominal exam to visualize the uterus, endometrium, and
ovaries. Color and duplex Doppler ultrasound was utilized to
evaluate blood flow to the ovaries.

[Series 1: us transvaginal non-ob · 13 of 91 slices shown]
[im 1/91]
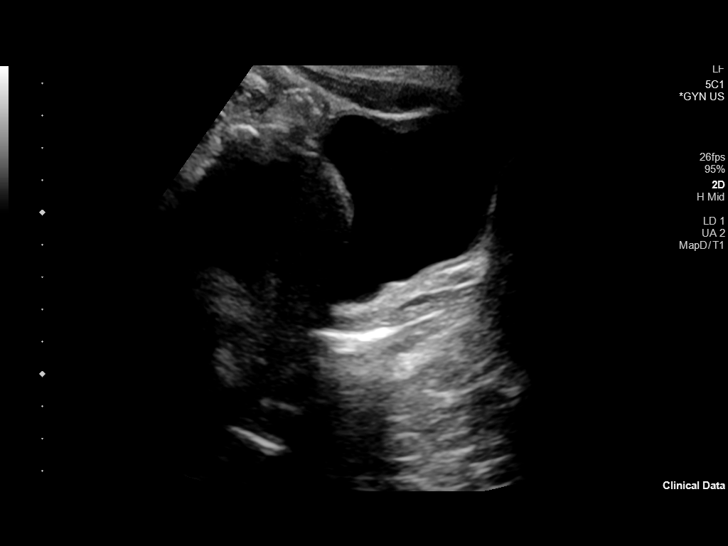
[im 8/91]
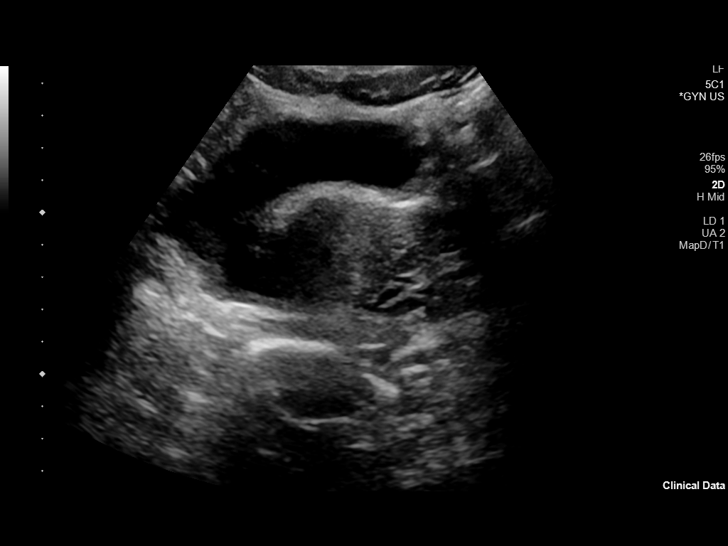
[im 16/91]
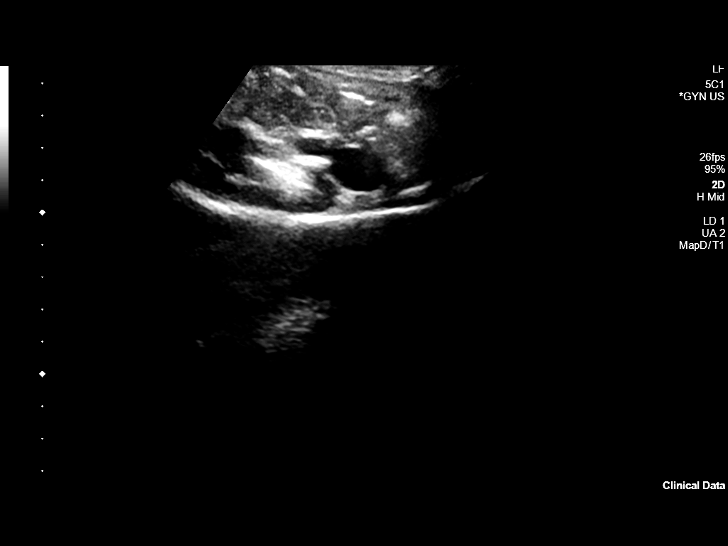
[im 23/91]
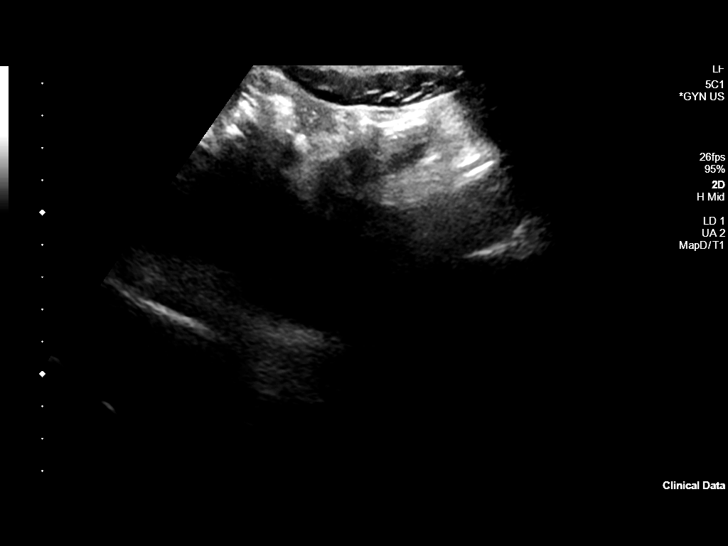
[im 31/91]
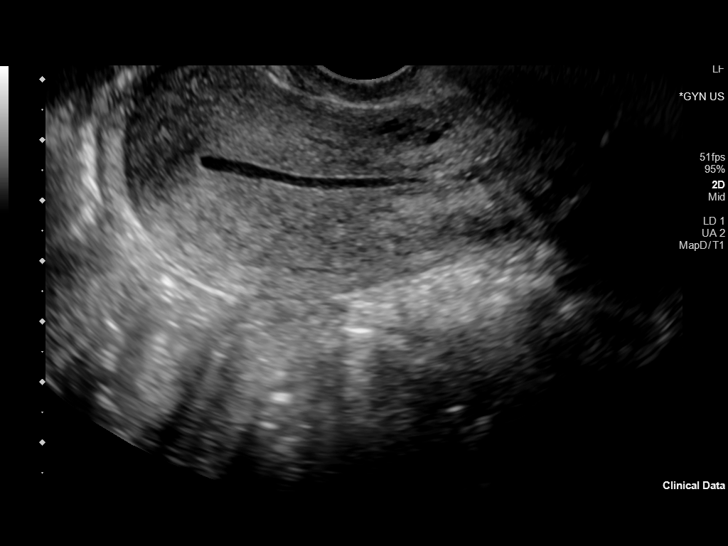
[im 38/91]
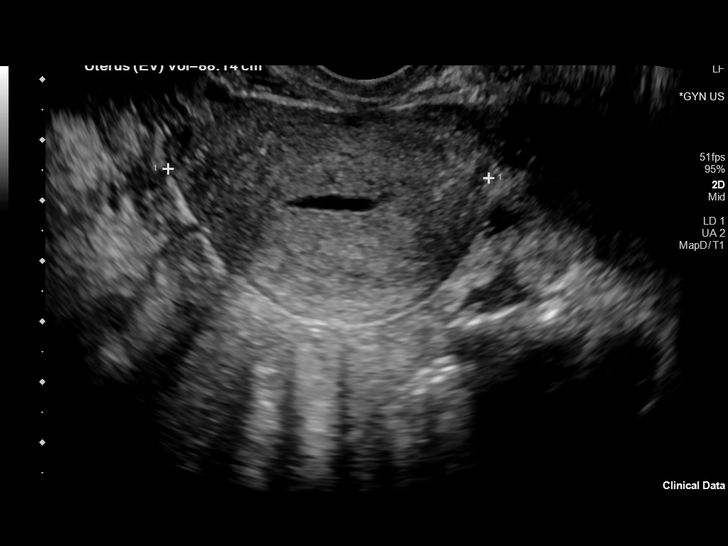
[im 46/91]
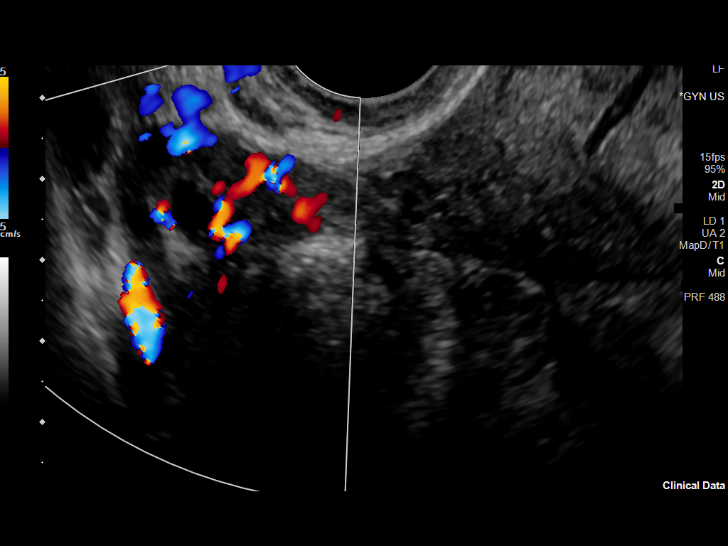
[im 53/91]
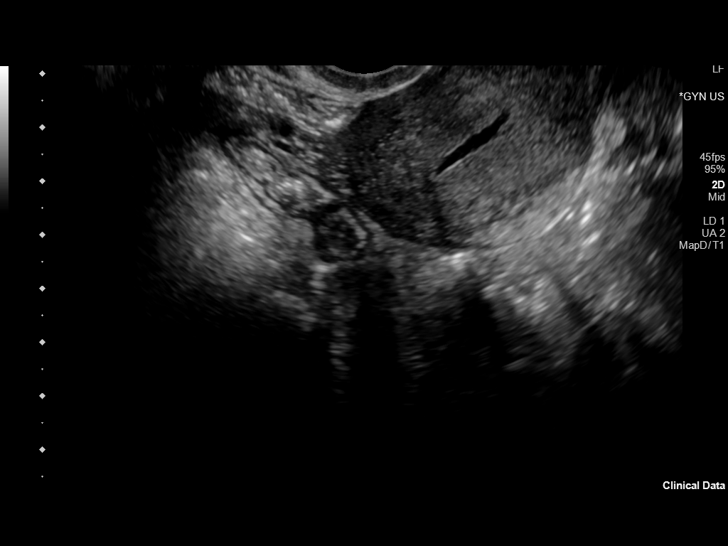
[im 61/91]
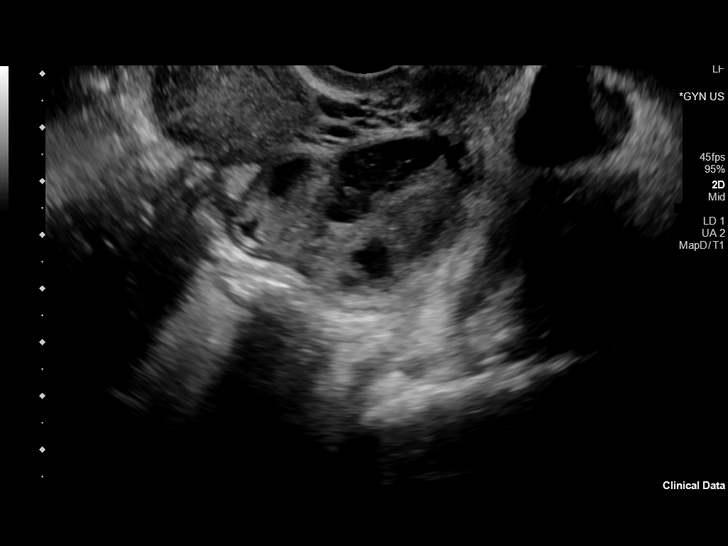
[im 68/91]
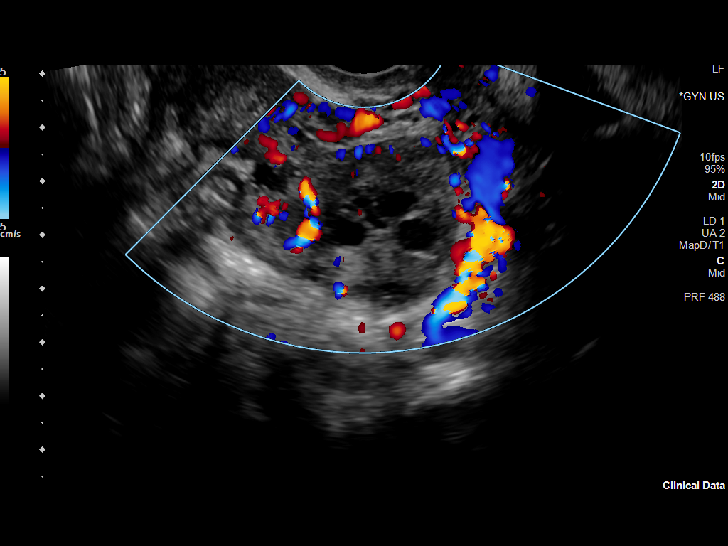
[im 76/91]
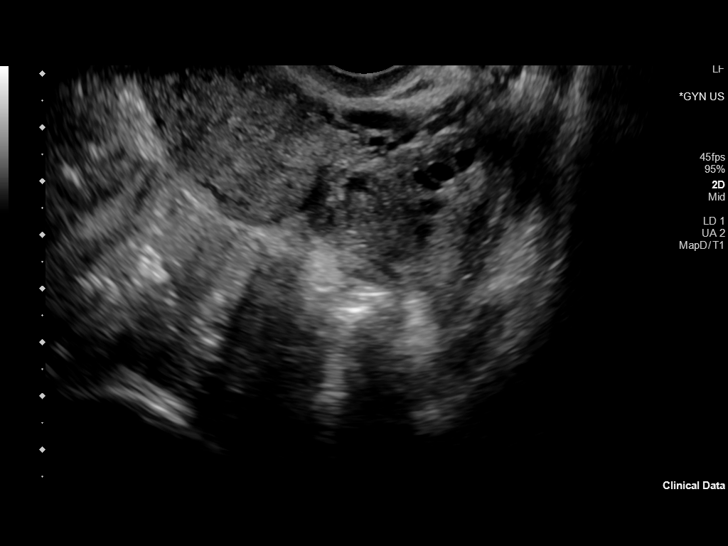
[im 83/91]
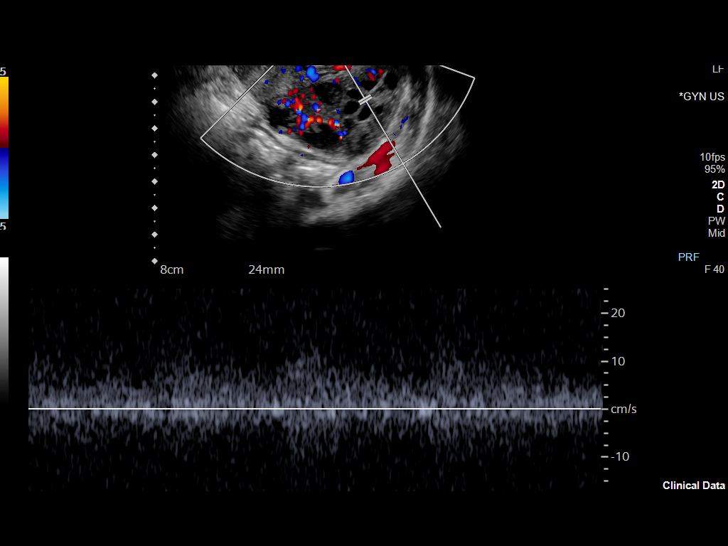
[im 91/91]
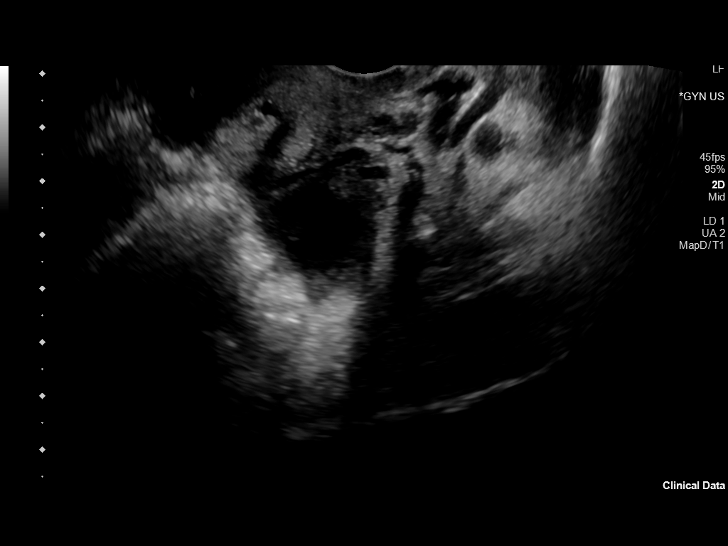

[13 of 25 positions shown; findings below may reference images not displayed]

FINDINGS: Uterus

Measurements: 8.4 x 3.8 x 5.3 cm = volume: 88 mL. Anteverted. Normal
morphology without mass

Endometrium

Thickness: 2 mm. Small amount of nonspecific endometrial fluid. No
focal mass.

Right ovary

Measurements: 4.1 x 2.6 x 2.7 cm = volume: 15.6 mL. Normal
morphology without mass

Left ovary

Measurements: 4.9 x 3.5 x 5.4 cm = volume: 47.6 mL. Small
hemorrhagic corpus luteum 2.8 cm diameter; no follow-up imaging
recommended. Scattered follicles without additional mass. Internal
blood flow present on color Doppler imaging.

Pulsed Doppler evaluation of both ovaries demonstrates normal
low-resistance arterial and venous waveforms.

Other findings

Small amount of nonspecific free pelvic fluid. No adnexal masses or
fallopian tube dilatation.
IMPRESSION: Small amount of nonspecific free pelvic fluid.

Otherwise negative exam.

No evidence of ovarian mass or torsion.
